# Patient Record
Sex: Male | Born: 1995 | Race: Black or African American | Hispanic: No | Marital: Married | Smoking: Never smoker
Health system: Southern US, Community
[De-identification: ages and names within clinical notes are randomized; demographics above are authoritative.]

## PROBLEM LIST (undated history)

## (undated) DIAGNOSIS — T7840XA Allergy, unspecified, initial encounter: Secondary | ICD-10-CM

## (undated) HISTORY — DX: Allergy, unspecified, initial encounter: T78.40XA

---
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
## 2015-03-18 ENCOUNTER — Ambulatory Visit (INDEPENDENT_AMBULATORY_CARE_PROVIDER_SITE_OTHER): Payer: TRICARE For Life (TFL) | Admitting: Family Medicine

## 2015-03-18 VITALS — BP 108/62 | HR 67 | Temp 98.1°F | Resp 18 | Ht 68.5 in | Wt 170.0 lb

## 2015-03-18 DIAGNOSIS — S90521A Blister (nonthermal), right ankle, initial encounter: Secondary | ICD-10-CM

## 2015-03-18 DIAGNOSIS — T63481A Toxic effect of venom of other arthropod, accidental (unintentional), initial encounter: Secondary | ICD-10-CM

## 2015-03-18 DIAGNOSIS — R2241 Localized swelling, mass and lump, right lower limb: Secondary | ICD-10-CM

## 2015-03-18 MED ORDER — METHYLPREDNISOLONE ACETATE 80 MG/ML IJ SUSP
80.0000 mg | Freq: Once | INTRAMUSCULAR | Status: AC
  Administered 2015-03-18: 80 mg via INTRAMUSCULAR

## 2015-03-18 MED ORDER — TRIAMCINOLONE ACETONIDE 0.1 % EX CREA: 1.0000 "application " | TOPICAL_CREAM | Freq: Two times a day (BID) | CUTANEOUS | Status: DC

## 2015-03-18 NOTE — Patient Instructions (Signed)
You have received a shot of Depo-Medrol 80 which is a cortisone type medication that will help to counteract the allergic reaction  Take over-the-counter Zyrtec (cetirizine) one daily for itching and allergic reaction for the next few days  Apply some cool compresses on the ankle might help the swelling to go down  Use triamcinolone cream 2 or 3 times daily to the area of rash  Return if worse or further concerns  Get rid of the yellow jacket nest if there is one in your yard.

## 2015-03-18 NOTE — Progress Notes (Signed)
  Subjective:  Patient ID: Alfred Dodson, male    DOB: 06-11-1996  Age: 19 y.o. MRN: [id number]  Patient was mowing his lawn a couple of days ago. He felt like some stung him on his foot. He is now swollen on his foot. He is graduating day after tomorrow. 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 Has not had major bee stings in the past. He did not see anything, but he saw some holes in the ground that he thinks something may have come from. Objective:   On his lateral aspect of his right ankle there is a little complex of blisters about 2 cm in diameter, with swelling of the rest of the ankle. It is warm to touch. Swelling goes almost up to his calf.  Assessment & Plan:   Assessment: Apparent staining, probably a yellow jacket or wasp. Does not appear infected at this time.  Plan: Patient Instructions  You have received a shot of Depo-Medrol 80 which is a cortisone type medication that will help to counteract the allergic reaction  Take over-the-counter Zyrtec (cetirizine) one daily for itching and allergic reaction for the next few days  Apply some cool compresses on the ankle might help the swelling to go down  Use triamcinolone cream 2 or 3 times daily to the area of rash  Return if worse or further concerns  Get rid of the yellow jacket nest if there is one in your yard.     HOPPER,DAVID, MD 03/18/2015

## 2016-03-20 ENCOUNTER — Ambulatory Visit (INDEPENDENT_AMBULATORY_CARE_PROVIDER_SITE_OTHER): Payer: BLUE CROSS/BLUE SHIELD | Admitting: Emergency Medicine

## 2016-03-20 VITALS — BP 120/78 | HR 60 | Temp 97.9°F | Resp 17 | Ht 68.0 in | Wt 178.0 lb

## 2016-03-20 DIAGNOSIS — Z0000 Encounter for general adult medical examination without abnormal findings: Secondary | ICD-10-CM

## 2016-03-20 DIAGNOSIS — Z025 Encounter for examination for participation in sport: Secondary | ICD-10-CM

## 2016-03-20 NOTE — Patient Instructions (Signed)
     IF you received an x-ray today, you will receive an invoice from [facility]. Please contact [facility] at [phone] with questions or concerns regarding your invoice.   IF you received labwork today, you will receive an invoice from [facility]. Please contact [facility] at [phone] with questions or concerns regarding your invoice.   Our billing staff will not be able to assist you with questions regarding bills from these companies.  You will be contacted with the lab results as soon as they are available. The fastest way to get your results is to activate your My Chart account. Instructions are located on the last page of this paperwork. If you have not heard from us regarding the results in 2 weeks, please contact this office.      

## 2016-03-20 NOTE — Progress Notes (Signed)
By signing my name below, I, Alfred Dodson, attest that this documentation has been prepared under the direction and in the presence of Lesle ChrisSteven Bryann Mcnealy, MD.  Electronically Signed: Arvilla MarketMesha Dodson, Medical Scribe. 03/20/2016. 1:50 PM.  Chief Complaint:  Chief Complaint  Patient presents with  . Annual Exam    HPI: Alfred SamplesJohnny Dodson is a 20 y.o. male who [facility] to get cleared to use the workout equipment [place] study abroad in [place]. Pt doesn't know of any medical problems he might have, and he doesn't take any medications. Pt works out a lot now. Pt doesn't play any sports, but played football in high school. Pt is UTD on his immunizations.  Pt goes to [employer].  Past Medical History  Diagnosis Date  . Allergy    No past surgical history on file. Social History   Social History  . Marital Status: Married    Spouse Name: N/A  . Number of Children: N/A  . Years of Education: N/A   Social History Main Topics  . Smoking status: Never Smoker   . Smokeless tobacco: None  . Alcohol Use: None  . Drug Use: None  . Sexual Activity: Not Asked   Other Topics Concern  . None   Social History Narrative   Family History  Problem Relation Age of Onset  . Hypertension Mother   . Hypertension Father   . Hypertension Maternal Grandmother   . Hypertension Maternal Grandfather   . Hypertension Paternal Grandmother   . Hypertension Paternal Grandfather    No Known Allergies Prior to Admission medications   Medication Sig Start Date End Date Taking? Authorizing Provider  doxycycline (VIBRA-TABS) 100 MG tablet Take 100 mg by mouth daily. For acne   Yes Historical Provider, MD     ROS: The patient denies fevers, chills, night sweats, unintentional weight loss, chest pain, palpitations, wheezing, dyspnea on exertion, nausea, vomiting, abdominal pain, dysuria, hematuria, melena, numbness, weakness, or tingling.  All other systems have been reviewed and were  otherwise negative with the exception of those mentioned in the HPI and as above.    PHYSICAL EXAM: Filed Vitals:   03/20/16 1217  BP: 120/78  Pulse: 60  Temp: 97.9 F (36.6 C)  Resp: 17   Body mass index is 27.07 kg/(m^2).   General: Alert, no acute distress HEENT:  Normocephalic, atraumatic, oropharynx patent. Eye[facility] Cardiovascular:  Regular rate and rhythm, no rubs murmurs or gallops.  No Carotid bruits, radial pulse intact. No pedal edema.  Respiratory: Clear to auscultation bilaterally.  No wheezes, rales, or rhonchi.  No cyanosis, no use of accessory musculature Abdominal: No organomegaly, abdomen is soft and non-tender, positive bowel sounds.  No masses. Musculoskeletal: Gait intact. No edema, tenderness Skin: No rashes. Neurologic: Facial musculature symmetric. Psychiatric: Patient acts appropriately throughout our interaction. Lymphatic:There also small less than 1 cm shotty posterior cervical nodes. These are not fixed.   LABS:    EKG/XRAY:   Primary [facility]   ASSESSMENT/PLAN: Form signed so he can exercise. I advised him when he returns from his trip in 2 months to let me reexamine his neck and check the nodes in his neck.I personally performed the services described in this documentation, which was scribed in my presence. The recorded information has been reviewed and is accurate.   Gross sideeffects, risk and benefits, and alternatives of medications d/w patient. Patient is aware that all medications have potential sideeffects  and we are unable to predict every sideeffect or drug-drug interaction that may occur.  Lesle Chris MD 03/20/2016 1:50 PM

## 2016-05-30 ENCOUNTER — Ambulatory Visit (INDEPENDENT_AMBULATORY_CARE_PROVIDER_SITE_OTHER): Payer: TRICARE For Life (TFL) | Admitting: Family Medicine

## 2016-05-30 VITALS — BP 116/70 | HR 64 | Temp 97.8°F | Resp 15 | Ht 68.25 in | Wt 181.0 lb

## 2016-05-30 DIAGNOSIS — L989 Disorder of the skin and subcutaneous tissue, unspecified: Secondary | ICD-10-CM

## 2016-05-30 NOTE — Progress Notes (Signed)
   Subjective:    Patient ID: Alfred Dodson, male    DOB: 24-Jun-1996, 20 y.o.   MRN: [id number]  05/30/2016  Nevus (on left leg notice x 2 weeks ago, per patient mom just to make sure its an mole and not something else )   HPI This 20 y.o. male presents for evaluation of L distal lateral skin lesion.  Noticed during spring months; mother is worried about melanoma.  No pain, itching, wound, drainage, pustules.  No history of keloid formation.  Pt rarely notices area. Has dermatologist at [facility] yet leaving to return to college next week.   Review of Systems  Constitutional: Negative for chills, diaphoresis, fatigue and fever.  Skin: Positive for color change. Negative for pallor, rash and wound.    Past Medical History:  Diagnosis Date  . Allergy    History reviewed. No pertinent surgical history. No Known Allergies Current Outpatient Prescriptions  Medication Sig Dispense Refill  . doxycycline (VIBRA-TABS) 100 MG tablet Take 100 mg by mouth daily. For acne     No current facility-administered medications for this visit.    Social History   Social History  . Marital status: Married    Spouse name: N/A  . Number of children: N/A  . Years of education: N/A   Occupational History  . Not on file.   Social History Main Topics  . Smoking status: Never Smoker  . Smokeless tobacco: Never Used  . Alcohol use Not on file  . Drug use: Unknown  . Sexual activity: Not on file   Other Topics Concern  . Not on file   Social History Narrative  . No narrative on file   Family History  Problem Relation Age of Onset  . Hypertension Mother   . Hypertension Father   . Hypertension Maternal Grandmother   . Hypertension Maternal Grandfather   . Hypertension Paternal Grandmother   . Hypertension Paternal Grandfather        Objective:    BP 116/70 (BP Location: Left Arm, Patient Position: Sitting, Cuff Size: Normal)   Pulse 64   Temp 97.8 F (36.6 C) (Oral)   Resp 15    Ht 5' 8.25" (1.734 m)   Wt 181 lb (82.1 kg)   SpO2 100%   BMI 27.32 kg/m  Physical Exam  Constitutional: He appears well-developed and well-nourished. No distress.  Musculoskeletal:       Legs: Skin: He is not diaphoretic.  Two discrete skin lesions along lateral L leg consistent with round nevus and elongated nevus.   No results found for this or any previous visit.     Assessment & Plan:   1. Leg skin lesion, left    -s/p shave biopsy of two lesions along lateral leg with irregular borders. -send for pathology   No orders of the defined types were placed in this encounter.  No orders of the defined types were placed in this encounter.   No Follow-up on file.   Demont Linford Paulita FujitaMartin Nyjae Hodge, M.D. [facility]  [place] [address] [place], [place]  [id number] [phone] phone [phone] fax

## 2016-05-30 NOTE — Patient Instructions (Addendum)
IF you received an x-ray today, you will receive an invoice from [facility]. Please contact [facility] at [phone] with questions or concerns regarding your invoice.   IF you received labwork today, you will receive an invoice from [facility]. Please contact [facility] at [phone] with questions or concerns regarding your invoice.   Our billing staff will not be able to assist you with questions regarding bills from these companies.  You will be contacted with the lab results as soon as they are available. The fastest way to get your results is to activate your My Chart account. Instructions are located on the last page of this paperwork. If you have not heard from us regarding the results in 2 weeks, please contact this office.     Excision of Skin Lesions Excision of a skin lesion refers to the removal of a section of skin by making small cuts (incisions) in the skin. This procedure may be done to remove a cancerous (malignant) or noncancerous (benign) growth on the skin. It is typically done to treat or prevent cancer or infection. It may also be done to improve cosmetic appearance. The procedure may be done to remove:  Cancerous growths, such as basal cell carcinoma, squamous cell carcinoma, or melanoma.  Noncancerous growths, such as a cyst or lipoma.  Growths, such as moles or skin tags, which may be removed for cosmetic reasons. Various excision or surgical techniques may be used depending on your condition, the location of the lesion, and your overall health. LET [facility] KNOW ABOUT:  Any allergies you have.  All medicines you are taking, including vitamins, herbs, eye drops, creams, and over-the-counter medicines.  Previous problems you or members of your family have had with the use of anesthetics.  Any blood disorders you have.  Previous surgeries you have had.  Any medical conditions you  have.  Whether you are pregnant or may be pregnant. RISKS AND COMPLICATIONS Generally, this is a safe procedure. However, problems may occur, including:  Bleeding.  Infection.  Scarring.  Recurrence of the cyst, lipoma, or cancer.  Changes in skin sensation or appearance, such as discoloration or swelling.  Reaction to the anesthetics.  Allergic reaction to surgical materials or ointments.  Damage to nerves, blood vessels, muscles, or other structures.  Continued pain. BEFORE THE PROCEDURE  Ask your health care provider about:  Changing or stopping your regular medicines. This is especially important if you are taking diabetes medicines or blood thinners.  Taking medicines such as aspirin and ibuprofen. These medicines can thin your blood. Do not take these medicines before your procedure if your health care provider instructs you not to.  You may be asked to take certain medicines.  You may be asked to stop smoking.  You may have an exam or testing.  Plan to have someone take you home after the procedure.  Plan to have someone help you with activities during recovery. PROCEDURE  To reduce your risk of infection:  Your health care team will wash or sanitize their hands.  Your skin will be washed with soap.  You will be given a medicine to numb the area (local anesthetic).  One of the following excision techniques will be performed.  At the end of any of these procedures, antibiotic ointment will be applied as needed. Each of the following techniques may vary among health care providers and hospitals. Complete Surgical Excision The area of skin that needs to  be removed will be marked with a pen. Using a small scalpel or scissors, the surgeon will gently cut around and under the lesion until it is completely removed. The lesion will be placed in a fluid and sent to the lab for examination. If necessary, bleeding will be controlled with a device that delivers heat  (electrocautery). The edges of the wound may be stitched (sutured) together, and a bandage (dressing) will be applied. This procedure may be performed to treat a cancerous growth or a noncancerous cyst or lesion. Excision of a Cyst The surgeon will make an incision on the cyst. The entire cyst will be removed through the incision. The incision may be closed with sutures. Shave Excision During shave excision, the surgeon will use a small blade or an electrically heated loop instrument to shave off the lesion. This may be done to remove a mole or a skin tag. The wound will usually be left to heal on its own without sutures. Punch Excision During punch excision, the surgeon will use a small tool that is like a cookie cutter or a hole punch to cut a circle shape out of the skin. The outer edges of the skin will be sutured together. This may be done to remove a mole or a scar or to perform a biopsy of the lesion. Mohs Micrographic Surgery During Mohs micrographic surgery, layers of the lesion will be removed with a scalpel or a loop instrument and will be examined right away under a microscope. Layers will be removed until all of the abnormal or cancerous tissue has been removed. This procedure is minimally invasive, and it ensures the best cosmetic outcome. It involves the removal of as little normal tissue as possible. Mohs is usually done to treat skin cancer, such as basal cell carcinoma or squamous cell carcinoma, particularly on the face and ears. Depending on the size of the surgical wound, it may be sutured closed. AFTER THE PROCEDURE  Return to your normal activities as told by your health care provider.  Talk with your health care provider to discuss any test results, treatment options, and if necessary, the need for more tests.   This information is not intended to replace advice given to you by your health care provider. Make sure you discuss any questions you have with your health care  provider.   Document Released: 12/26/2009 Document Revised: 06/22/2015 Document Reviewed: 11/17/2014 Elsevier Interactive Patient Education [employer]2016 Elsevier Inc.

## 2016-06-05 ENCOUNTER — Telehealth: Payer: Self-pay

## 2016-06-05 NOTE — Telephone Encounter (Signed)
PATIENT'S MOTHER (TIA) CALLED BECAUSE SHE HAS NOT HEARD ANYTHING BACK REGARDING THE LAB WORK DONE ON HIS SKIN LESION ON HIS (L) LEG. SHE WOULD REALLY LIKE TO GET THOSE RESULTS BECAUSE SHE SAID IT HAS BEEN A WEEK. BEST PHONE [phone] (TIA [facility] CE[facility]) SHE SAID TO PLEASE LEAVE A MESSAGE IF SHE DOES NOT ANSWER. (HIS MOM IS ON HIS HIPAA) (PHARMACY CHOICE IS [facility] ON [address])  MBC

## 2016-06-06 ENCOUNTER — Telehealth: Payer: Self-pay | Admitting: Emergency Medicine

## 2016-06-06 NOTE — Telephone Encounter (Signed)
Mother came in today concerned of Dermatology pathology results.   Deliah BostonMichael Clark, PA reviewed results and copy given to patient mother for reassurance.

## 2016-06-06 NOTE — Telephone Encounter (Signed)
I believe it looks normal can you review? Benign nevus

## 2016-06-11 NOTE — Progress Notes (Signed)
PROCEDURE NOTE: Shave biopsy. Verbal consent obtained. Alcohol prep pad used over left lateral lower extremity. ~4cc of lidocaine with epinephrine was used for local anaesthesia. Sterile prep with iodine swabs. A total of 2 shave biopsies were performed for a total of ~2cm. Cautery performed with silver nitrate. Cleansed and dressed. Wound care reviewed.  Alfred BambergMario Mani, PA-C

## 2016-06-15 NOTE — Telephone Encounter (Signed)
Results provided to patient on 06/06/16.

## 2016-09-29 ENCOUNTER — Ambulatory Visit: Payer: TRICARE For Life (TFL)

## 2016-10-01 ENCOUNTER — Ambulatory Visit: Payer: TRICARE For Life (TFL)

## 2016-10-17 ENCOUNTER — Ambulatory Visit (INDEPENDENT_AMBULATORY_CARE_PROVIDER_SITE_OTHER): Payer: TRICARE For Life (TFL) | Admitting: Family Medicine

## 2016-10-17 VITALS — BP 118/76 | HR 67 | Temp 97.7°F | Resp 16 | Ht 69.0 in | Wt 180.0 lb

## 2016-10-17 DIAGNOSIS — R0982 Postnasal drip: Secondary | ICD-10-CM

## 2016-10-17 DIAGNOSIS — R05 Cough: Secondary | ICD-10-CM | POA: Diagnosis not present

## 2016-10-17 DIAGNOSIS — L639 Alopecia areata, unspecified: Secondary | ICD-10-CM

## 2016-10-17 DIAGNOSIS — Z113 Encounter for screening for infections with a predominantly sexual mode of transmission: Secondary | ICD-10-CM | POA: Diagnosis not present

## 2016-10-17 DIAGNOSIS — R053 Chronic cough: Secondary | ICD-10-CM

## 2016-10-17 MED ORDER — FLUTICASONE PROPIONATE 50 MCG/ACT NA SUSP: 2.0000 | Freq: Every day | NASAL | 6 refills | Status: DC

## 2016-10-17 MED ORDER — AMOXICILLIN 875 MG PO TABS: 875.0000 mg | ORAL_TABLET | Freq: Two times a day (BID) | ORAL | 0 refills | Status: DC

## 2016-10-17 NOTE — Patient Instructions (Addendum)
I think what is going on is that you have chronic cough from postnasal drip. Use the Flonase once a day for the next several weeks. This should help calm that down.  I have printed a prescription for amoxicillin for you. I do not think you'll actually need this.  The Flonase should straighten this out in the next week or so.  We'll let you know the lab results when they come back in a day or two.  Best of luck with your studies!    IF you received an x-ray today, you will receive an invoice from [facility]. Please contact [facility] at [phone] with questions or concerns regarding your invoice.   IF you received labwork today, you will receive an invoice from [facility]. Please contact [facility] at [phone] with questions or concerns regarding your invoice.   Our billing staff will not be able to assist you with questions regarding bills from these companies.  You will be contacted with the lab results as soon as they are available. The fastest way to get your results is to activate your My Chart account. Instructions are located on the last page of this paperwork. If you have not heard from us regarding the results in 2 weeks, please contact this office.

## 2016-10-17 NOTE — Progress Notes (Signed)
   Alfred Dodson is a 21 y.o. male who presents to [facility] today for several issues:  1.  Cough:  Present for past 2-3 months.  He initially had onset of URI symptoms about 3 months ago. This was treated with azithromycin at that time. This was while [place] was in school in [place].  Since then he has had persistent dry hacking cough. He has also had continued nasal drainage. He has not tried anything for relief. He does have seasonal allergies in the spring triggered by pollen for which she takes cetirizine. No fevers or chills. No history of asthma. No reflux symptoms.  #2. Hair falling out: Patient has this after having his hair cut short about 6 weeks ago. He had another haircut about 2 weeks ago noticed some new patches. 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  He has not tried anything for relief for this. He is not know anyone around him has been diagnosed with a fungal infection.  ROS as above.    PMH reviewed. Patient is a nonsmoker.   Past Medical History:  Diagnosis Date  . Allergy    No past surgical history on file.  Medications reviewed. Current Outpatient Prescriptions  Medication Sig Dispense Refill  . doxycycline (VIBRA-TABS) 100 MG tablet Take 100 mg by mouth daily. For acne     No current facility-administered medications for this visit.      Physical Exam:  BP 118/76 (BP Location: Right Arm, Patient Position: Sitting, Cuff Size: Large)   Pulse 67   Temp 97.7 F (36.5 C) (Oral)   Resp 16   Ht 5\' 9"  (1.753 m)   Wt 180 lb (81.6 kg)   SpO2 100%   BMI 26.58 kg/m  Gen:  Alert, cooperative patient who appears stated age in no acute distress.  Vital signs reviewed. Head: [place]/AT.   Eyes:  EOMI, PERRL.   Ears:  External ears WNL, Bilateral TM's normal without retraction, redness or bulging. Nose:  Septum midline.  With clear drainage some boggy turbinates noted bilaterally. Mouth:  MMM,  tonsils non-erythematous, non-edematous.   Pulm:  Clear to auscultation bilaterally with good air movement.  No wheezes or rales noted.   Cardiac:  Regular rate and rhythm without murmur auscultated.  Good S1/S2. Abd:  Soft/nondistended/nontender.   Ext:  No LE edema.   SKin:  Three separate circular patches of alopecia noted on back of scalp and occiput area. Largest is 2 cm in diameter and smallest is about half centimeter diameter.  Does have some broken hair follicles in each patch.  Smaller patches show new growth of hair.    Assessment and Plan:  1.  Postnasal drip: -Seems to be secondary to chronic nasal drainage from indoor allergies. -Treat with cetirizine and Flonase.   #2. Chronic cough: - likely secondary to #1 above.   - treating with flonase.  -This is most likely cause of cough. No reflux symptoms. No history of asthma.  3.  Alopecia areata: - Telogen effluvium also possibility. He was under fair amount of stress last semester. -He already has appointment scheduled for this coming Friday with dermatology. .  Hold on any topical steroids as treatment until he is evaluated by dermatologist.  4.  STD check: - patient asks for this today.  Will obtain urine and blood.

## 2016-10-18 LAB — HIV ANTIBODY (ROUTINE TESTING W REFLEX): HIV SCREEN 4TH GENERATION: NONREACTIVE

## 2016-10-18 LAB — RPR: RPR Ser Ql: NONREACTIVE

## 2016-11-14 ENCOUNTER — Telehealth: Payer: Self-pay

## 2016-11-14 NOTE — Telephone Encounter (Signed)
Pt had labs done 10-18-15 and was never told what the results were.  Please call [phone]

## 2016-11-14 NOTE — Telephone Encounter (Signed)
Pt advised of negative labs

## 2016-11-14 NOTE — Telephone Encounter (Signed)
Thank you for discussing with the patient.

## 2018-02-28 ENCOUNTER — Encounter: Payer: Self-pay | Admitting: Family Medicine

## 2018-03-05 ENCOUNTER — Encounter: Payer: Self-pay | Admitting: Family Medicine

## 2018-11-07 ENCOUNTER — Encounter: Payer: Self-pay | Admitting: Emergency Medicine

## 2018-11-07 ENCOUNTER — Ambulatory Visit
Admission: EM | Admit: 2018-11-07 | Discharge: 2018-11-07 | Disposition: A | Discharge status: Home / Self Care | Attending: Nurse Practitioner | Admitting: Nurse Practitioner

## 2018-11-07 DIAGNOSIS — Z113 Encounter for screening for infections with a predominantly sexual mode of transmission: Secondary | ICD-10-CM | POA: Insufficient documentation

## 2018-11-07 DIAGNOSIS — Z0000 Encounter for general adult medical examination without abnormal findings: Secondary | ICD-10-CM | POA: Diagnosis not present

## 2018-11-07 NOTE — ED Notes (Signed)
Patient able to ambulate independently  

## 2018-11-07 NOTE — ED Triage Notes (Signed)
Pt presents to [facility] for physical and STI testing, denies symptoms.

## 2018-11-07 NOTE — ED Provider Notes (Signed)
[facility]    CSN: [id number] Arrival date & time: 11/07/18  1539     History   Chief Complaint No chief complaint on file.   HPI Alfred Dodson is a 23 y.o. male.   Subjective:   Alfred Dodson is an 23 y.o. 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 He denies any Dodson medical problems. He is also requesting STD testing. He denies any specific symptoms or known STD exposures at this time. Questionnaire and forms reviewed with patient and responses verified. Immunizations are up to date.   The following portions of the patient's history were reviewed and updated as appropriate: allergies, Dodson medications, past family history, past medical history, past social history, past surgical history and problem list.           Past Medical History:  Diagnosis Date  . Allergy     There are no active problems to display for this patient.   No past surgical history on file.     Home Medications    Prior to Admission medications   Medication Sig Start Date End Date Taking? Authorizing Provider  tretinoin (RETIN-A) 0.025 % cream Apply topically at bedtime.   Yes [provider]    Family History Family History  Problem Relation Age of Onset  . Hypertension Mother   . Hypertension Father   . Hypertension Maternal Grandmother   . Hypertension Maternal Grandfather   . Hypertension Paternal Grandmother   . Hypertension Paternal Grandfather     Social History Social History   Tobacco Use  . Smoking status: Never Smoker  . Smokeless tobacco: Never Used  Substance Use Topics  . Alcohol use: Not on file  . Drug use: Not on file     Allergies   Patient has no known allergies.   Review of Systems Review of Systems  Constitutional: Negative.   HENT: Negative.   Respiratory: Negative.   Cardiovascular: Negative.   Gastrointestinal: Negative.   Genitourinary: Negative.   Musculoskeletal: Negative.   Skin:  Negative.   Neurological: Negative.      Physical Exam Triage Vital Signs ED Triage Vitals [11/07/18 1551]  Enc Vitals Group     BP 115/64     Pulse Rate (!) 59     Resp 16     Temp (!) 97.5 F (36.4 C)     Temp Source Oral     SpO2 98 %     Weight      Height      Head Circumference      Peak Flow      Pain Score      Pain Loc      Pain Edu?      Excl. in GC?    No data found.  Updated Vital Signs BP 115/64 (BP Location: Left Arm)   Pulse (!) 59   Temp (!) 97.5 F (36.4 C) (Oral)   Resp 16   SpO2 98%   Visual Acuity Right Eye Distance:   Left Eye Distance:   Bilateral Distance:    Right Eye Near:   Left Eye Near:    Bilateral Near:     Physical Exam Constitutional:      Appearance: Normal appearance.  HENT:     Head: Normocephalic and atraumatic.     Right Ear: Tympanic membrane, ear canal and external ear normal.     Left Ear: Tympanic membrane, ear canal and external ear normal.     Nose:  Nose normal.     Mouth/Throat:     Mouth: Mucous membranes are moist.     Pharynx: Oropharynx is clear.  Eyes:     Extraocular Movements: Extraocular movements intact.     Conjunctiva/sclera: Conjunctivae normal.     Pupils: Pupils are equal, round, and reactive to light.  Neck:     Musculoskeletal: Normal range of motion and neck supple.  Cardiovascular:     Rate and Rhythm: Normal rate and regular rhythm.     Pulses: Normal pulses.     Heart sounds: Normal heart sounds.  Pulmonary:     Effort: Pulmonary effort is normal.     Breath sounds: Normal breath sounds.  Abdominal:     General: Bowel sounds are normal.     Palpations: Abdomen is soft.  Musculoskeletal: Normal range of motion.  Skin:    General: Skin is warm and dry.  Neurological:     General: No focal deficit present.     Mental Status: He is alert and oriented to person, place, and time.  Psychiatric:        Mood and Affect: Mood normal.        Behavior: Behavior normal.        Thought  Content: Thought content normal.        Judgment: Judgment normal.      UC Treatments / Results  Labs (all labs ordered are listed, but only abnormal results are displayed) Labs Reviewed  URINE CYTOLOGY ANCILLARY ONLY    EKG None  Radiology No results found.  Procedures Procedures (including critical care time)  Medications Ordered in UC Medications - No data to display  Initial Impression / Assessment and Plan / UC Course  I have reviewed the triage vital signs and the nursing notes.  Pertinent labs & imaging results that were available during my care of the patient were reviewed by me and considered in my medical decision making (see chart for details).     23 y.o. male who presents for physical exam for fraternity membership and STD testing. Satisfactory physical exam. Urine sent out for GC/Chlaymida/trichomonas. Advised follow-up with health department for complete STD testing.    Plan:  1. Completed, signed and returned forms. 2. Reviewed health maintenance, contraception, STDs, and substance abuse. 3. Follow up as needed.  Today's evaluation has revealed no signs of a dangerous process. Discussed diagnosis with patient. Patient aware of their diagnosis, possible red flag symptoms to watch out for and need for close follow up. Patient understands verbal and written discharge instructions. Patient comfortable with plan and disposition.  Patient has a clear mental status at this time, good insight into illness (after discussion and teaching) and has clear judgment to make decisions regarding their care.  Documentation was completed with the aid of voice recognition software. Transcription may contain typographical errors. Final Clinical Impressions(s) / UC Diagnoses   Final diagnoses:  Encounter for screening examination for sexually transmitted disease  Encounter for physical examination   Discharge Instructions   None    ED Prescriptions    None      Controlled Substance Prescriptions [place] Controlled Substance Registry consulted? Not Applicable   Lurline Idol, [place] 11/07/18 [phone]

## 2018-11-10 LAB — URINE CYTOLOGY ANCILLARY ONLY
Chlamydia: NEGATIVE
Neisseria Gonorrhea: NEGATIVE
Trichomonas: NEGATIVE
